# Patient Record
Sex: Female | Born: 1965 | Hispanic: No | Marital: Married | Smoking: Never smoker
Health system: Southern US, Community
[De-identification: ages and names within clinical notes are randomized; demographics above are authoritative.]

## PROBLEM LIST (undated history)

## (undated) DIAGNOSIS — E785 Hyperlipidemia, unspecified: Secondary | ICD-10-CM

## (undated) HISTORY — DX: Hyperlipidemia, unspecified: E78.5

---

## 1998-06-23 ENCOUNTER — Other Ambulatory Visit: Admission: RE | Admit: 1998-06-23 | Discharge: 1998-06-23 | Payer: Self-pay | Admitting: Obstetrics and Gynecology

## 1999-07-26 ENCOUNTER — Other Ambulatory Visit: Admission: RE | Admit: 1999-07-26 | Discharge: 1999-07-26 | Payer: Self-pay | Admitting: Obstetrics and Gynecology

## 2000-09-11 ENCOUNTER — Other Ambulatory Visit: Admission: RE | Admit: 2000-09-11 | Discharge: 2000-09-11 | Payer: Self-pay | Admitting: Obstetrics and Gynecology

## 2001-09-25 ENCOUNTER — Other Ambulatory Visit: Admission: RE | Admit: 2001-09-25 | Discharge: 2001-09-25 | Payer: Self-pay | Admitting: Obstetrics and Gynecology

## 2002-05-21 ENCOUNTER — Encounter: Admission: RE | Admit: 2002-05-21 | Discharge: 2002-05-21 | Payer: Self-pay | Admitting: Obstetrics and Gynecology

## 2002-05-21 ENCOUNTER — Encounter: Payer: Self-pay | Admitting: Obstetrics and Gynecology

## 2002-10-28 ENCOUNTER — Other Ambulatory Visit: Admission: RE | Admit: 2002-10-28 | Discharge: 2002-10-28 | Payer: Self-pay | Admitting: Obstetrics and Gynecology

## 2002-11-30 ENCOUNTER — Encounter: Admission: RE | Admit: 2002-11-30 | Discharge: 2002-11-30 | Payer: Self-pay | Admitting: Obstetrics and Gynecology

## 2002-11-30 ENCOUNTER — Encounter: Payer: Self-pay | Admitting: Obstetrics and Gynecology

## 2003-11-14 ENCOUNTER — Observation Stay (HOSPITAL_COMMUNITY): Admission: AD | Admit: 2003-11-14 | Discharge: 2003-11-15 | Payer: Self-pay | Admitting: *Deleted

## 2003-12-23 ENCOUNTER — Inpatient Hospital Stay (HOSPITAL_COMMUNITY): Admission: AD | Admit: 2003-12-23 | Discharge: 2003-12-26 | Payer: Self-pay | Admitting: Obstetrics and Gynecology

## 2004-02-03 ENCOUNTER — Other Ambulatory Visit: Admission: RE | Admit: 2004-02-03 | Discharge: 2004-02-03 | Payer: Self-pay | Admitting: Obstetrics and Gynecology

## 2005-03-08 ENCOUNTER — Other Ambulatory Visit: Admission: RE | Admit: 2005-03-08 | Discharge: 2005-03-08 | Payer: Self-pay | Admitting: Obstetrics and Gynecology

## 2010-07-08 ENCOUNTER — Encounter: Payer: Self-pay | Admitting: Obstetrics and Gynecology

## 2012-12-07 ENCOUNTER — Other Ambulatory Visit: Payer: Self-pay | Admitting: Obstetrics and Gynecology

## 2012-12-07 DIAGNOSIS — R928 Other abnormal and inconclusive findings on diagnostic imaging of breast: Secondary | ICD-10-CM

## 2012-12-25 ENCOUNTER — Ambulatory Visit
Admission: RE | Admit: 2012-12-25 | Discharge: 2012-12-25 | Disposition: A | Discharge status: Home / Self Care | Payer: BC Managed Care – PPO | Source: Ambulatory Visit | Attending: Obstetrics and Gynecology | Admitting: Obstetrics and Gynecology

## 2012-12-25 DIAGNOSIS — R928 Other abnormal and inconclusive findings on diagnostic imaging of breast: Secondary | ICD-10-CM

## 2013-12-30 ENCOUNTER — Other Ambulatory Visit: Payer: Self-pay | Admitting: Obstetrics and Gynecology

## 2013-12-30 DIAGNOSIS — Z803 Family history of malignant neoplasm of breast: Secondary | ICD-10-CM

## 2014-01-07 ENCOUNTER — Ambulatory Visit
Admission: RE | Admit: 2014-01-07 | Discharge: 2014-01-07 | Disposition: A | Discharge status: Home / Self Care | Payer: BC Managed Care – PPO | Source: Ambulatory Visit | Attending: Obstetrics and Gynecology | Admitting: Obstetrics and Gynecology

## 2014-01-07 DIAGNOSIS — Z803 Family history of malignant neoplasm of breast: Secondary | ICD-10-CM

## 2014-01-07 MED ORDER — GADOBENATE DIMEGLUMINE 529 MG/ML IV SOLN
10.0000 mL | Freq: Once | INTRAVENOUS | Status: AC | PRN
  Administered 2014-01-07: 10 mL via INTRAVENOUS

## 2014-03-23 ENCOUNTER — Other Ambulatory Visit: Payer: Self-pay | Admitting: Obstetrics and Gynecology

## 2014-03-24 LAB — CYTOLOGY - PAP

## 2015-12-21 ENCOUNTER — Other Ambulatory Visit: Payer: Self-pay | Admitting: Obstetrics and Gynecology

## 2015-12-21 DIAGNOSIS — R928 Other abnormal and inconclusive findings on diagnostic imaging of breast: Secondary | ICD-10-CM

## 2015-12-29 ENCOUNTER — Other Ambulatory Visit: Payer: Self-pay | Admitting: Obstetrics and Gynecology

## 2015-12-29 ENCOUNTER — Ambulatory Visit
Admission: RE | Admit: 2015-12-29 | Discharge: 2015-12-29 | Disposition: A | Discharge status: Home / Self Care | Payer: 59 | Source: Ambulatory Visit | Attending: Obstetrics and Gynecology | Admitting: Obstetrics and Gynecology

## 2015-12-29 DIAGNOSIS — R928 Other abnormal and inconclusive findings on diagnostic imaging of breast: Secondary | ICD-10-CM

## 2016-01-05 ENCOUNTER — Ambulatory Visit
Admission: RE | Admit: 2016-01-05 | Discharge: 2016-01-05 | Disposition: A | Discharge status: Home / Self Care | Payer: 59 | Source: Ambulatory Visit | Attending: Obstetrics and Gynecology | Admitting: Obstetrics and Gynecology

## 2016-01-05 ENCOUNTER — Other Ambulatory Visit: Payer: Self-pay | Admitting: Obstetrics and Gynecology

## 2016-01-05 DIAGNOSIS — R921 Mammographic calcification found on diagnostic imaging of breast: Secondary | ICD-10-CM

## 2016-01-05 DIAGNOSIS — R928 Other abnormal and inconclusive findings on diagnostic imaging of breast: Secondary | ICD-10-CM

## 2016-01-08 ENCOUNTER — Inpatient Hospital Stay: Admission: RE | Admit: 2016-01-08 | Payer: 59 | Source: Ambulatory Visit

## 2016-08-30 ENCOUNTER — Ambulatory Visit (INDEPENDENT_AMBULATORY_CARE_PROVIDER_SITE_OTHER): Payer: 59 | Admitting: Family Medicine

## 2016-08-30 VITALS — BP 110/68 | HR 72 | Temp 98.8°F | Resp 18 | Ht 62.6 in | Wt 111.6 lb

## 2016-08-30 DIAGNOSIS — K644 Residual hemorrhoidal skin tags: Secondary | ICD-10-CM | POA: Diagnosis not present

## 2016-08-30 MED ORDER — HYDROCORTISONE 2.5 % RE CREA: 1.0000 "application " | TOPICAL_CREAM | Freq: Two times a day (BID) | RECTAL | 0 refills | Status: DC

## 2016-08-30 NOTE — Progress Notes (Signed)
  Chief Complaint  Patient presents with  . Hemorrhoids    HPI Pt reports that she had a history of hemorrhoids She reports that she has some rectal pain, no itching No bleeding  She reports that she can feel "a little lump going in and out" She reports that she has not been constipation She has a colonoscopy planned for this year.    No past medical history on file.  Current Outpatient Prescriptions  Medication Sig Dispense Refill  . hydrocortisone (ANUSOL-HC) 2.5 % rectal cream Place 1 application rectally 2 (two) times daily. Use for one week to relieve symptoms 30 g 0   No current facility-administered medications for this visit.     Allergies: No Known Allergies  Past Surgical History:  Procedure Laterality Date  . CESAREAN SECTION      Social History   Social History  . Marital status: Married    Spouse name: N/A  . Number of children: N/A  . Years of education: N/A   Social History Main Topics  . Smoking status: Never Smoker  . Smokeless tobacco: Never Used  . Alcohol use No  . Drug use: Unknown  . Sexual activity: Not Asked   Other Topics Concern  . None   Social History Narrative  . None    ROS  Objective: Vitals:   08/30/16 1458  BP: 110/68  Pulse: 72  Resp: 18  Temp: 98.8 F (37.1 C)  TempSrc: Oral  SpO2: 100%  Weight: 111 lb 9.6 oz (50.6 kg)  Height: 5' 2.6" (1.59 m)    Physical Exam  Constitutional: She is oriented to person, place, and time. She appears well-developed and well-nourished.  HENT:  Head: Normocephalic and atraumatic.  Eyes: Conjunctivae and EOM are normal.  Cardiovascular: Normal rate, regular rhythm and normal heart sounds.   Pulmonary/Chest: Effort normal and breath sounds normal. No respiratory distress. She has no wheezes.  Neurological: She is alert and oriented to person, place, and time.  Skin: Skin is warm. No rash noted. No erythema.  Psychiatric: She has a normal mood and affect. Her behavior is  normal. Judgment and thought content normal.    Visible external hemorrhoid with small thrombosis nontender to palpation Rectal exam shows normal tone No fissures  Assessment and Plan Elmarie was seen today for hemorrhoids.  Diagnoses and all orders for this visit:  External hemorrhoid Gave handout for sitz baths as needed Advised pt to that she should use anusol She should increase fiber Since pt already has plan for colonoscopy will not do a second referral as a routine colonoscopy will suffice -     hydrocortisone (ANUSOL-HC) 2.5 % rectal cream; Place 1 application rectally 2 (two) times daily. Use for one week to relieve symptoms     Waldo Damian A Creta LevinStallings

## 2016-08-30 NOTE — Patient Instructions (Addendum)
   IF you received an x-ray today, you will receive an invoice from [facility]. Please contact [facility] at [phone] with questions or concerns regarding your invoice.   IF you received labwork today, you will receive an invoice from [facility]. Please contact [facility] at [phone] with questions or concerns regarding your invoice.   Our billing staff will not be able to assist you with questions regarding bills from these companies.  You will be contacted with the lab results as soon as they are available. The fastest way to get your results is to activate your My Chart account. Instructions are located on the last page of this paperwork. If you have not heard from us regarding the results in 2 weeks, please contact this office.     Hemorrhoids Hemorrhoids are swollen veins in and around the rectum or anus. There are two types of hemorrhoids:  Internal hemorrhoids. These occur in the veins that are just inside the rectum. They may poke through to the outside and become irritated and painful.  External hemorrhoids. These occur in the veins that are outside of the anus and can be felt as a painful swelling or hard lump near the anus.  Most hemorrhoids do not cause serious problems, and they can be managed with home treatments such as diet and lifestyle changes. If home treatments do not help your symptoms, procedures can be done to shrink or remove the hemorrhoids. What are the causes? This condition is caused by increased pressure in the anal area. This pressure may result from various things, including:  Constipation.  Straining to have a bowel movement.  Diarrhea.  Pregnancy.  Obesity.  Sitting for long periods of time.  Heavy lifting or other activity that causes you to strain.  Anal sex.  What are the signs or symptoms? Symptoms of this condition include:  Pain.  Anal itching or irritation.  Rectal bleeding.  Leakage of stool  (feces).  Anal swelling.  One or more lumps around the anus.  How is this diagnosed? This condition can often be diagnosed through a visual exam. Other exams or tests may also be done, such as:  Examination of the rectal area with a gloved hand (digital rectal exam).  Examination of the anal canal using a small tube (anoscope).  A blood test, if you have lost a significant amount of blood.  A test to look inside the colon (sigmoidoscopy or colonoscopy).  How is this treated? This condition can usually be treated at home. However, various procedures may be done if dietary changes, lifestyle changes, and other home treatments do not help your symptoms. These procedures can help make the hemorrhoids smaller or remove them completely. Some of these procedures involve surgery, and others do not. Common procedures include:  Rubber band ligation. Rubber bands are placed at the base of the hemorrhoids to cut off the blood supply to them.  Sclerotherapy. Medicine is injected into the hemorrhoids to shrink them.  Infrared coagulation. A type of light energy is used to get rid of the hemorrhoids.  Hemorrhoidectomy surgery. The hemorrhoids are surgically removed, and the veins that supply them are tied off.  Stapled hemorrhoidopexy surgery. A circular stapling device is used to remove the hemorrhoids and use staples to cut off the blood supply to them.  Follow these instructions at home: Eating and drinking  Eat foods that have a lot of fiber in them, such as whole grains, beans, nuts, fruits, and vegetables. Ask your health care   provider about taking products that have added fiber (fiber supplements).  Drink enough fluid to keep your urine clear or pale yellow. Managing pain and swelling  Take warm sitz baths for 20 minutes, 3-4 times a day to ease pain and discomfort.  If directed, apply ice to the affected area. Using ice packs between sitz baths may be helpful. ? Put ice in a plastic  bag. ? Place a towel between your skin and the bag. ? Leave the ice on for 20 minutes, 2-3 times a day. General instructions  Take over-the-counter and prescription medicines only as told by your health care provider.  Use medicated creams or suppositories as told.  Exercise regularly.  Go to the bathroom when you have the urge to have a bowel movement. Do not wait.  Avoid straining to have bowel movements.  Keep the anal area dry and clean. Use wet toilet paper or moist towelettes after a bowel movement.  Do not sit on the toilet for long periods of time. This increases blood pooling and pain. Contact a health care provider if:  You have increasing pain and swelling that are not controlled by treatment or medicine.  You have uncontrolled bleeding.  You have difficulty having a bowel movement, or you are unable to have a bowel movement.  You have pain or inflammation outside the area of the hemorrhoids. This information is not intended to replace advice given to you by your health care provider. Make sure you discuss any questions you have with your health care provider. Document Released: 05/31/2000 Document Revised: 11/01/2015 Document Reviewed: 02/15/2015 Elsevier Interactive Patient Education  2017 [employer]  

## 2016-12-16 DIAGNOSIS — Z1231 Encounter for screening mammogram for malignant neoplasm of breast: Secondary | ICD-10-CM | POA: Diagnosis not present

## 2017-01-30 IMAGING — MG DIGITAL DIAGNOSTIC UNILATERAL RIGHT MAMMOGRAM
2 series · 2 of 2 positions shown · non-contrast
Comparison: Previous exam(s).

CLINICAL DATA: Post right breast stereotactic biopsy.

EXAM:
DIAGNOSTIC RIGHT MAMMOGRAM POST STEREOTACTIC BIOPSY

[R CC]
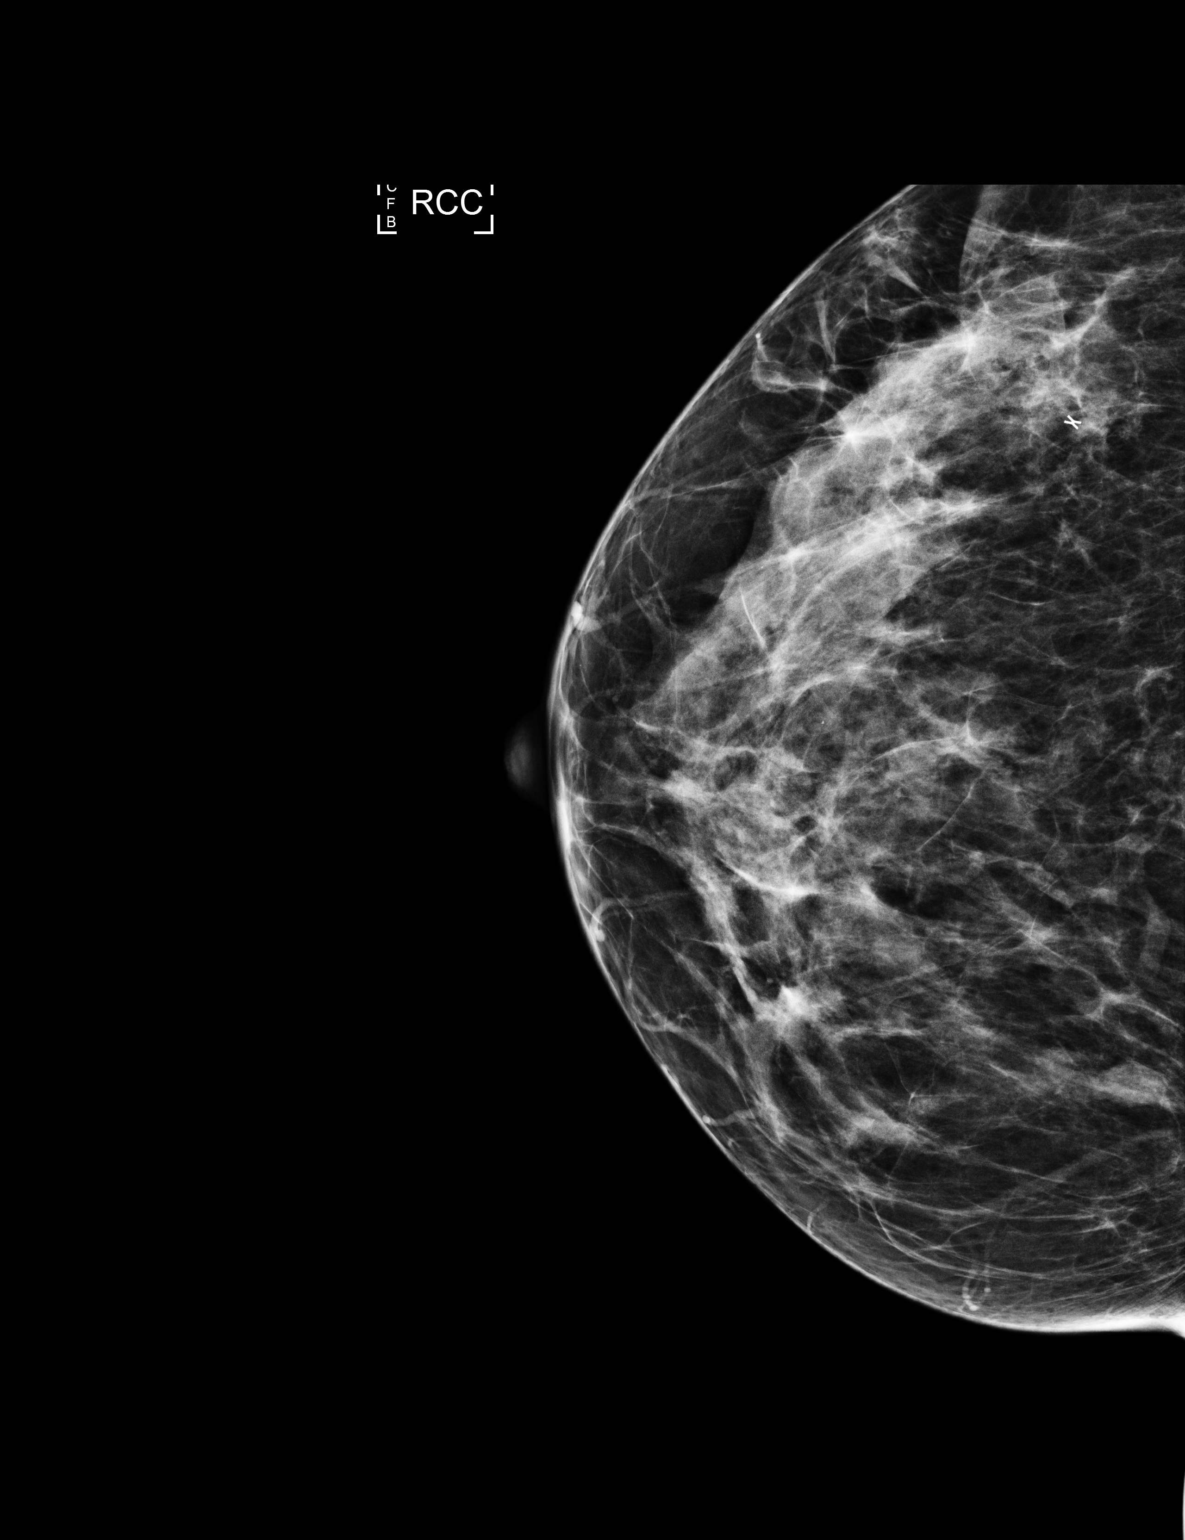

[R ML]
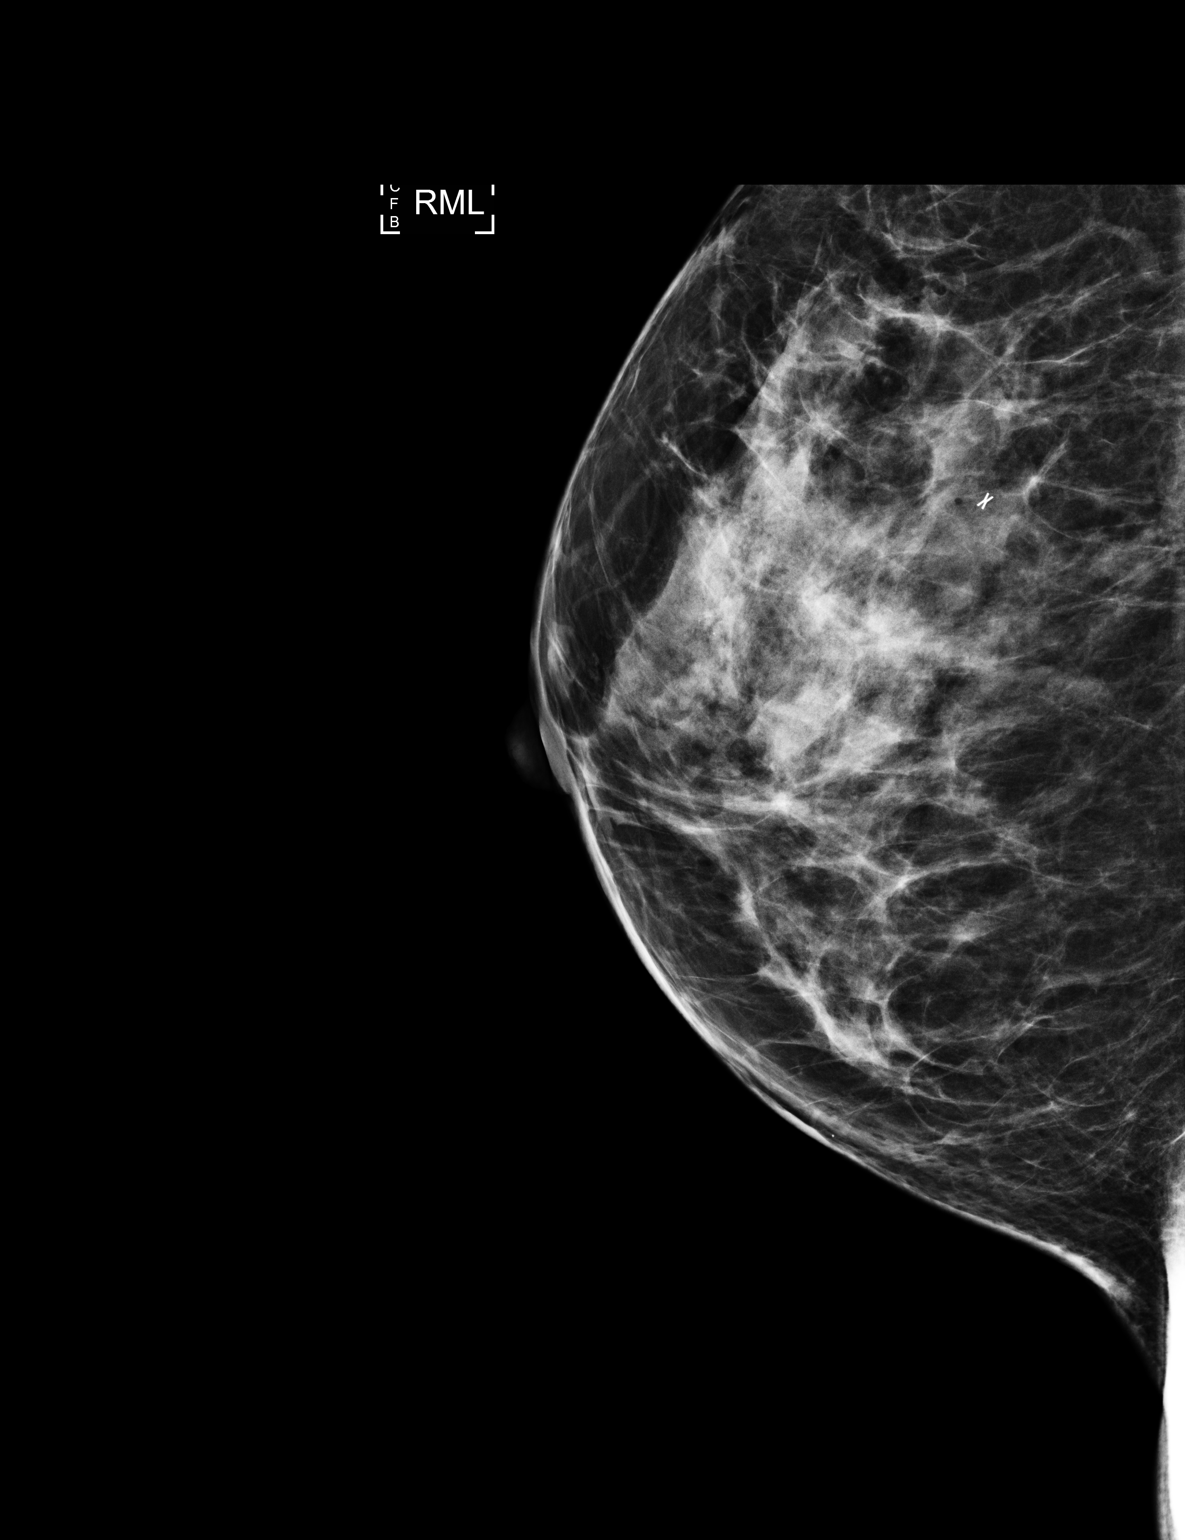

[2 of 2 positions shown; findings below may reference images not displayed]

FINDINGS: Mammographic images were obtained following stereotactic guided
biopsy of the small group of calcifications located within the
upper-outer quadrant of the right breast. The X shaped clip is in
appropriate position.
IMPRESSION: Appropriate position of clip following right breast stereotactic
biopsy.

Final Assessment: Post Procedure Mammograms for Marker Placement

## 2017-04-12 DIAGNOSIS — H40033 Anatomical narrow angle, bilateral: Secondary | ICD-10-CM | POA: Diagnosis not present

## 2017-04-12 DIAGNOSIS — H2513 Age-related nuclear cataract, bilateral: Secondary | ICD-10-CM | POA: Diagnosis not present

## 2017-04-21 DIAGNOSIS — Z01419 Encounter for gynecological examination (general) (routine) without abnormal findings: Secondary | ICD-10-CM | POA: Diagnosis not present

## 2017-04-21 DIAGNOSIS — Z6820 Body mass index (BMI) 20.0-20.9, adult: Secondary | ICD-10-CM | POA: Diagnosis not present

## 2017-05-21 DIAGNOSIS — Z13820 Encounter for screening for osteoporosis: Secondary | ICD-10-CM | POA: Diagnosis not present

## 2017-06-16 DIAGNOSIS — N390 Urinary tract infection, site not specified: Secondary | ICD-10-CM | POA: Diagnosis not present

## 2017-07-09 DIAGNOSIS — N390 Urinary tract infection, site not specified: Secondary | ICD-10-CM | POA: Diagnosis not present

## 2017-07-11 DIAGNOSIS — K640 First degree hemorrhoids: Secondary | ICD-10-CM | POA: Diagnosis not present

## 2017-07-11 DIAGNOSIS — Z1211 Encounter for screening for malignant neoplasm of colon: Secondary | ICD-10-CM | POA: Diagnosis not present

## 2017-12-24 DIAGNOSIS — Z1231 Encounter for screening mammogram for malignant neoplasm of breast: Secondary | ICD-10-CM | POA: Diagnosis not present

## 2018-04-27 DIAGNOSIS — Z01419 Encounter for gynecological examination (general) (routine) without abnormal findings: Secondary | ICD-10-CM | POA: Diagnosis not present

## 2018-04-27 DIAGNOSIS — Z6822 Body mass index (BMI) 22.0-22.9, adult: Secondary | ICD-10-CM | POA: Diagnosis not present

## 2018-06-22 DIAGNOSIS — Z13220 Encounter for screening for lipoid disorders: Secondary | ICD-10-CM | POA: Diagnosis not present

## 2018-06-22 DIAGNOSIS — Z13228 Encounter for screening for other metabolic disorders: Secondary | ICD-10-CM | POA: Diagnosis not present

## 2018-06-22 DIAGNOSIS — Z1329 Encounter for screening for other suspected endocrine disorder: Secondary | ICD-10-CM | POA: Diagnosis not present

## 2019-02-04 ENCOUNTER — Other Ambulatory Visit: Payer: Self-pay | Admitting: Obstetrics and Gynecology

## 2019-02-04 DIAGNOSIS — Z803 Family history of malignant neoplasm of breast: Secondary | ICD-10-CM

## 2021-12-24 ENCOUNTER — Telehealth: Payer: Self-pay

## 2021-12-24 NOTE — Telephone Encounter (Signed)
NOTES SCANNED TO REFERRAL 

## 2021-12-30 NOTE — Progress Notes (Unsigned)
Cardiology Office Note:   Date:  12/31/2021  NAME:  Shelia Mclean    MRN: [id number] DOB:  1965-09-22   PCP:  Richardean Chimera, MD  Cardiologist:  None  Electrophysiologist:  None   Referring MD: Richardean Chimera, MD   Chief Complaint  Patient presents with   Chest Pain    History of Present Illness:   Shelia Mclean is a 56 y.o. female with a hx of HLD who is being seen today for the evaluation of chest pain at the request of McComb, John, MD. she presents with complaints of chest pain and shortness of breath.  Apparently she can get sharp central chest pain that seems to be triggered by stress that occurs every few months.  She reports she had some on July 4.  She thought of her mother who passed away.  Apparently this resulted in chest discomfort that went away.  She is able to exercise on a treadmill several days per week.  She has no chest pain or shortness of breath with this.  She also gets short of breath with laying flat.  Apparently this occurs with laying on her side.  She can lay on her back and do just fine.  She does inform me that she had a episode when she was pregnant 18 years ago.  I did review her CT scans from 2005.  There was evidence of a chest x-ray with pulmonary vascular congestion.  She apparently was treated and had no other issues.  I wonder if she had preeclampsia.  She has had no further episodes.  No congestive heart failure.  She does not smoke.  No alcohol or drug use is reported.  She works as a Neurosurgeon.  She presents with her 87 year old daughter.  She was told she had a heart condition but I suspect this was just possible preeclampsia in the setting of pregnancy with advanced maternal age.  Her blood pressure is 100/60.  She does take Zetia.  She is intolerant of statins.  Cholesterol level seems acceptable.  No strong family history of heart disease.  Again her symptoms can occur with stress regarding her chest pain and positional with her shortness of breath.  She  has no evidence of heart failure on today's exam.  Her EKG is normal.    A1c 5.7 T chol 202, HDL 53, LDL 137, TG 64  Problem List Pre-eclampsia?  Past Medical History: Past Medical History:  Diagnosis Date   Hyperlipidemia     Past Surgical History: Past Surgical History:  Procedure Laterality Date   CESAREAN SECTION      Current Medications: Current Meds  Medication Sig   Calcium Carbonate (CALCIUM 600 PO) Take by mouth.   cycloSPORINE (RESTASIS) 0.05 % ophthalmic emulsion Place 1 drop into both eyes 2 (two) times daily.   ezetimibe (ZETIA) 10 MG tablet Take 1 tablet by mouth daily.   Multiple Vitamin (MULTIVITAMIN ADULT PO) Take by mouth.   VITAMIN D PO Take by mouth.     Allergies:    Turmeric and Rosuvastatin   Social History: Social History   Socioeconomic History   Marital status: Married    Spouse name: Not on file   Number of children: 2   Years of education: Not on file   Highest education level: Not on file  Occupational History   Occupation: Neurosurgeon  Tobacco Use   Smoking status: Never   Smokeless tobacco: Never  Substance and Sexual Activity   Alcohol  use: No   Drug use: Never   Sexual activity: Not on file  Other Topics Concern   Not on file  Social History Narrative   Not on file   Social Determinants of Health   Financial Resource Strain: Not on file  Food Insecurity: Not on file  Transportation Needs: Not on file  Physical Activity: Not on file  Stress: Not on file  Social Connections: Not on file     Family History: The patient's family history is not on file.  ROS:   All other ROS reviewed and negative. Pertinent positives noted in the HPI.     EKGs/Labs/Other Studies Reviewed:   The following studies were personally reviewed by me today:  EKG:  EKG is ordered today.  The ekg ordered today demonstrates sinus bradycardia heart rate 53, no acute ischemic changes, and was personally reviewed by me.   Recent Labs: No  results found for requested labs within last 365 days.   Recent Lipid Panel No results found for: "CHOL", "TRIG", "HDL", "CHOLHDL", "VLDL", "LDLCALC", "LDLDIRECT"  Physical Exam:   VS:  BP 100/60   Pulse (!) 59   Ht 5\' 1"  (1.549 m)   Wt 117 lb (53.1 kg)   SpO2 98%   BMI 22.11 kg/m    Wt Readings from Last 3 Encounters:  12/31/21 117 lb (53.1 kg)  08/30/16 111 lb 9.6 oz (50.6 kg)    General: Well nourished, well developed, in no acute distress Head: Atraumatic, normal size  Eyes: PEERLA, EOMI  Neck: Supple, no JVD Endocrine: No thryomegaly Cardiac: Normal S1, S2; RRR; no murmurs, rubs, or gallops Lungs: Clear to auscultation bilaterally, no wheezing, rhonchi or rales  Abd: Soft, nontender, no hepatomegaly  Ext: No edema, pulses 2+ Musculoskeletal: No deformities, BUE and BLE strength normal and equal Skin: Warm and dry, no rashes   Neuro: Alert and oriented to person, place, time, and situation, CNII-XII grossly intact, no focal deficits  Psych: Normal mood and affect   ASSESSMENT:   Shelia Mclean is a 56 y.o. female who presents for the following: 1. Precordial pain   2. SOB (shortness of breath)   3. Mixed hyperlipidemia     PLAN:   1. Precordial pain -Intermittent sharp chest pain associated with stress and thoughts of family members have passed.  Symptoms occur infrequently every few months.  EKG is normal.  She can exercise on a treadmill for 30 minutes without chest pain symptoms.  I see no need for further cardiac testing.  Her CV exam is normal.  There is no family history of heart disease.  2. SOB (shortness of breath) -Intermittent shortness of breath with laying on her left or right side.  Does not occur when she lays flat on her back.  Unclear etiology.  She did have a chest x-ray in 2005 when she was pregnant with vascular congestion.  I suspect she had preeclampsia in the setting of advanced maternal age.  She has had no further episodes.  No evidence of heart  failure today on exam.  EKG is normal.  No signs of volume overload.  Unclear etiology.  We will check an echo just to make sure her heart structurally normal.  We will also check a TSH and BNP.  She may just need a chest x-ray.  She has no infectious symptoms.  Unclear what could be causing this.  Would recommend further work-up if our work-up is negative.  3. Mixed hyperlipidemia -Okay to continue  Zetia.  Overall low risk for future heart disease development.      Disposition: Return if symptoms worsen or fail to improve.  Medication Adjustments/Labs and Tests Ordered: Current medicines are reviewed at length with the patient today.  Concerns regarding medicines are outlined above.  Orders Placed This Encounter  Procedures   TSH   Brain natriuretic peptide   EKG 12-Lead   ECHOCARDIOGRAM COMPLETE   No orders of the defined types were placed in this encounter.   Patient Instructions  Medication Instructions:  The current medical regimen is effective;  continue present plan and medications.  *If you need a refill on your cardiac medications before your next appointment, please call your pharmacy*   Lab Work: TSH, BNP today   If you have labs (blood work) drawn today and your tests are completely normal, you will receive your results only by: MyChart Message (if you have MyChart) OR A paper copy in the mail If you have any lab test that is abnormal or we need to change your treatment, we will call you to review the results.   Testing/Procedures: Echocardiogram - Your physician has requested that you have an echocardiogram. Echocardiography is a painless test that uses sound waves to create images of your heart. It provides your doctor with information about the size and shape of your heart and how well your heart's chambers and valves are working. This procedure takes approximately one hour. There are no restrictions for this procedure.     Follow-Up: At [facility], you  and your health needs are our priority.  As part of our continuing mission to provide you with exceptional heart care, we have created designated Provider Care Teams.  These Care Teams include your primary Cardiologist (physician) and Advanced Practice Providers (APPs -  Physician Assistants and Nurse Practitioners) who all work together to provide you with the care you need, when you need it.  We recommend signing up for the patient portal called "MyChart".  Sign up information is provided on this After Visit Summary.  MyChart is used to connect with patients for Virtual Visits (Telemedicine).  Patients are able to view lab/test results, encounter notes, upcoming appointments, etc.  Non-urgent messages can be sent to your provider as well.   To learn more about what you can do with MyChart, go to [email or web address].    Your next appointment:   As needed  The format for your next appointment:   In Person  Provider:   Lennie Odor, MD            Signed, Lenna Gilford. Flora Lipps, MD, [facility]  [facility]  [address] [place], [place] [address] [phone]  12/31/2021 10:05 AM

## 2021-12-31 ENCOUNTER — Ambulatory Visit (INDEPENDENT_AMBULATORY_CARE_PROVIDER_SITE_OTHER): Payer: Commercial Managed Care - HMO | Admitting: Cardiovascular Disease

## 2021-12-31 ENCOUNTER — Encounter: Payer: Self-pay | Admitting: Cardiovascular Disease

## 2021-12-31 VITALS — BP 100/60 | HR 59 | Ht 61.0 in | Wt 117.0 lb

## 2021-12-31 DIAGNOSIS — R0602 Shortness of breath: Secondary | ICD-10-CM

## 2021-12-31 DIAGNOSIS — E782 Mixed hyperlipidemia: Secondary | ICD-10-CM | POA: Diagnosis not present

## 2021-12-31 DIAGNOSIS — R072 Precordial pain: Secondary | ICD-10-CM | POA: Diagnosis not present

## 2021-12-31 LAB — BRAIN NATRIURETIC PEPTIDE

## 2021-12-31 LAB — TSH: TSH: 1.1 u[IU]/mL (ref 0.450–4.500)

## 2021-12-31 NOTE — Patient Instructions (Signed)
Medication Instructions:  The current medical regimen is effective;  continue present plan and medications.  *If you need a refill on your cardiac medications before your next appointment, please call your pharmacy*   Lab Work: TSH, BNP today   If you have labs (blood work) drawn today and your tests are completely normal, you will receive your results only by: MyChart Message (if you have MyChart) OR A paper copy in the mail If you have any lab test that is abnormal or we need to change your treatment, we will call you to review the results.   Testing/Procedures: Echocardiogram - Your physician has requested that you have an echocardiogram. Echocardiography is a painless test that uses sound waves to create images of your heart. It provides your doctor with information about the size and shape of your heart and how well your heart's chambers and valves are working. This procedure takes approximately one hour. There are no restrictions for this procedure.     Follow-Up: At [facility], you and your health needs are our priority.  As part of our continuing mission to provide you with exceptional heart care, we have created designated Provider Care Teams.  These Care Teams include your primary Cardiologist (physician) and Advanced Practice Providers (APPs -  Physician Assistants and Nurse Practitioners) who all work together to provide you with the care you need, when you need it.  We recommend signing up for the patient portal called "MyChart".  Sign up information is provided on this After Visit Summary.  MyChart is used to connect with patients for Virtual Visits (Telemedicine).  Patients are able to view lab/test results, encounter notes, upcoming appointments, etc.  Non-urgent messages can be sent to your provider as well.   To learn more about what you can do with MyChart, go to [email or web address].    Your next appointment:   As needed  The format for your next appointment:    In Person  Provider:   Lennie Odor, MD

## 2022-01-07 ENCOUNTER — Telehealth: Payer: Self-pay

## 2022-01-07 NOTE — Telephone Encounter (Signed)
Called patient in regards to results.  Patient did not answer- unable to leave message.  Will mail letter.

## 2022-01-11 ENCOUNTER — Ambulatory Visit (HOSPITAL_COMMUNITY): Payer: Commercial Managed Care - HMO | Attending: Cardiovascular Disease

## 2022-01-11 DIAGNOSIS — I071 Rheumatic tricuspid insufficiency: Secondary | ICD-10-CM | POA: Insufficient documentation

## 2022-01-11 DIAGNOSIS — R0602 Shortness of breath: Secondary | ICD-10-CM | POA: Diagnosis not present

## 2022-01-11 LAB — ECHOCARDIOGRAM COMPLETE
Area-P 1/2: 3.5 cm2
S' Lateral: 2.2 cm

## 2022-08-15 NOTE — Progress Notes (Signed)
Cardiology Office Note:   Date:  08/16/2022  NAME:  Shelia Mclean    MRN: [id number] DOB:  [birth year]   PCP:  Arvella Nigh, MD  Cardiologist:  None  Electrophysiologist:  None   Referring MD: Arvella Nigh, MD   Chief Complaint  Patient presents with   Follow-up        History of Present Illness:   Shelia Mclean is a 57 y.o. female with a hx of HLD who presents for follow-up.  She reports her GYN was to be concerned about her triglycerides.  Value 189.  She is very healthy.  Her 10-year ASCVD risk was 1.2%.  There is really no indication to treat her isolated elevated triglycerides.  At this level they are not bothersome or worrisome.  They are not associated with increased risk given her very normal cardiac history.  An echocardiogram was performed in July that was normal.  Her examination remains normal.  She denies any chest pain or trouble breathing.  She works as a Regulatory affairs officer.  Blood pressure within limits.  All of her other values are also within limits.  T chol 205, HDL 52, LDL 120, TG 189  Past Medical History: Past Medical History:  Diagnosis Date   Hyperlipidemia     Past Surgical History: Past Surgical History:  Procedure Laterality Date   CESAREAN SECTION      Current Medications: Current Meds  Medication Sig   Calcium Carbonate (CALCIUM 600 PO) Take by mouth.   cycloSPORINE (RESTASIS) 0.05 % ophthalmic emulsion Place 1 drop into both eyes 2 (two) times daily.   ezetimibe (ZETIA) 10 MG tablet Take 1 tablet by mouth daily.   Multiple Vitamin (MULTIVITAMIN ADULT PO) Take by mouth.   VITAMIN D PO Take by mouth.     Allergies:    Turmeric and Rosuvastatin   Social History: Social History   Socioeconomic History   Marital status: Married    Spouse name: Not on file   Number of children: 2   Years of education: Not on file   Highest education level: Not on file  Occupational History   Occupation: Regulatory affairs officer  Tobacco Use   Smoking status: Never    Smokeless tobacco: Never  Substance and Sexual Activity   Alcohol use: No   Drug use: Never   Sexual activity: Not on file  Other Topics Concern   Not on file  Social History Narrative   Not on file   Social Determinants of Health   Financial Resource Strain: Not on file  Food Insecurity: Not on file  Transportation Needs: Not on file  Physical Activity: Not on file  Stress: Not on file  Social Connections: Not on file     Family History: The patient's family history is not on file.  ROS:   All other ROS reviewed and negative. Pertinent positives noted in the HPI.     EKGs/Labs/Other Studies Reviewed:   The following studies were personally reviewed by me today:   TTE 01/11/2022  1. Left ventricular ejection fraction, by estimation, is 60 to 65%. Left  ventricular ejection fraction by 3D volume is 59 %. The left ventricle has  normal function. The left ventricle has no regional wall motion  abnormalities. Left ventricular diastolic   parameters were normal. The average left ventricular global longitudinal  strain is -26.9 %. The global longitudinal strain is normal.   2. Right ventricular systolic function is normal. The right ventricular  size is normal. There is  normal pulmonary artery systolic pressure. The  estimated right ventricular systolic pressure is 123XX123 mmHg.   3. The mitral valve is normal in structure. Trivial mitral valve  regurgitation. No evidence of mitral stenosis.   4. The aortic valve is tricuspid. There is mild thickening of the aortic  valve. Aortic valve regurgitation is not visualized. No aortic stenosis is  present.   5. The inferior vena cava is normal in size with <50% respiratory  variability, suggesting right atrial pressure of 8 mmHg.   Recent Labs: 12/31/2021: BNP 22.0; TSH 1.100   Recent Lipid Panel No results found for: "CHOL", "TRIG", "HDL", "CHOLHDL", "VLDL", "LDLCALC", "LDLDIRECT"  Physical Exam:   VS:  BP 96/62   Pulse 66    Ht '5\' 1"'$  (1.549 m)   Wt 115 lb 12.8 oz (52.5 kg)   SpO2 99%   BMI 21.88 kg/m    Wt Readings from Last 3 Encounters:  08/16/22 115 lb 12.8 oz (52.5 kg)  12/31/21 117 lb (53.1 kg)  08/30/16 111 lb 9.6 oz (50.6 kg)    General: Well nourished, well developed, in no acute distress Head: Atraumatic, normal size  Eyes: PEERLA, EOMI  Neck: Supple, no JVD Endocrine: No thryomegaly Cardiac: Normal S1, S2; RRR; no murmurs, rubs, or gallops Lungs: Clear to auscultation bilaterally, no wheezing, rhonchi or rales  Abd: Soft, nontender, no hepatomegaly  Ext: No edema, pulses 2+ Musculoskeletal: No deformities, BUE and BLE strength normal and equal Skin: Warm and dry, no rashes   Neuro: Alert and oriented to person, place, time, and situation, CNII-XII grossly intact, no focal deficits  Psych: Normal mood and affect   ASSESSMENT:   Shelia Mclean is a 57 y.o. female who presents for the following: 1. Mixed hyperlipidemia     PLAN:   1. Mixed hyperlipidemia -Triglycerides are minimally elevated at 189.  10-year ASCVD risk score is 1.2% which is very low.  Echocardiogram in July was normal.  Her blood pressure is normal.  She denies any chest pains or trouble breathing.  There is really no indication to treat her isolated triglycerides with any specific medication.  She had issues with Crestor in the past.  I would just recommend diet and exercise.  She is on Zetia which is controlling her LDL cholesterol.  She overall is very low risk for heart disease.  Would recommend a conservative approach with diet.  We will send this information to her GYN.  She will see [place] back as needed.  Disposition: Return if symptoms worsen or fail to improve.  Medication Adjustments/Labs and Tests Ordered: Current medicines are reviewed at length with the patient today.  Concerns regarding medicines are outlined above.  No orders of the defined types were placed in this encounter.  No orders of the defined types  were placed in this encounter.   Patient Instructions  Medication Instructions:  The current medical regimen is effective;  continue present plan and medications.  *If you need a refill on your cardiac medications before your next appointment, please call your pharmacy*   Follow-Up: At [facility], you and your health needs are our priority.  As part of our continuing mission to provide you with exceptional heart care, we have created designated Provider Care Teams.  These Care Teams include your primary Cardiologist (physician) and Advanced Practice Providers (APPs -  Physician Assistants and Nurse Practitioners) who all work together to provide you with the care you need, when you need it.  We  recommend signing up for the patient portal called "MyChart".  Sign up information is provided on this After Visit Summary.  MyChart is used to connect with patients for Virtual Visits (Telemedicine).  Patients are able to view lab/test results, encounter notes, upcoming appointments, etc.  Non-urgent messages can be sent to your provider as well.   To learn more about what you can do with MyChart, go to [email or web address].    Your next appointment:   As needed  Provider:   Eleonore Chiquito, MD     Time Spent with Patient: I have spent a total of 25 minutes with patient reviewing hospital notes, telemetry, EKGs, labs and examining the patient as well as establishing an assessment and plan that was discussed with the patient.  > 50% of time was spent in direct patient care.  Signed, Addison Naegeli. Audie Box, MD, [place]  [address] [place], [place] [address] [phone]  08/16/2022 8:27 AM

## 2022-08-16 ENCOUNTER — Encounter: Payer: Self-pay | Admitting: Cardiovascular Disease

## 2022-08-16 ENCOUNTER — Ambulatory Visit: Payer: Commercial Managed Care - HMO | Attending: Cardiovascular Disease | Admitting: Cardiovascular Disease

## 2022-08-16 VITALS — BP 96/62 | HR 66 | Ht 61.0 in | Wt 115.8 lb

## 2022-08-16 DIAGNOSIS — E782 Mixed hyperlipidemia: Secondary | ICD-10-CM | POA: Diagnosis not present

## 2022-08-16 NOTE — Patient Instructions (Signed)
Medication Instructions:  The current medical regimen is effective;  continue present plan and medications.  *If you need a refill on your cardiac medications before your next appointment, please call your pharmacy*   Follow-Up: At [facility], you and your health needs are our priority.  As part of our continuing mission to provide you with exceptional heart care, we have created designated Provider Care Teams.  These Care Teams include your primary Cardiologist (physician) and Advanced Practice Providers (APPs -  Physician Assistants and Nurse Practitioners) who all work together to provide you with the care you need, when you need it.  We recommend signing up for the patient portal called "MyChart".  Sign up information is provided on this After Visit Summary.  MyChart is used to connect with patients for Virtual Visits (Telemedicine).  Patients are able to view lab/test results, encounter notes, upcoming appointments, etc.  Non-urgent messages can be sent to your provider as well.   To learn more about what you can do with MyChart, go to [email or web address].    Your next appointment:   As needed  Provider:   Eleonore Chiquito, MD

## 2023-04-30 ENCOUNTER — Other Ambulatory Visit: Payer: Self-pay | Admitting: Obstetrics and Gynecology

## 2023-04-30 DIAGNOSIS — R928 Other abnormal and inconclusive findings on diagnostic imaging of breast: Secondary | ICD-10-CM

## 2023-05-23 ENCOUNTER — Ambulatory Visit
Admission: RE | Admit: 2023-05-23 | Discharge: 2023-05-23 | Disposition: A | Discharge status: Home / Self Care | Payer: Commercial Managed Care - HMO | Source: Ambulatory Visit | Attending: Obstetrics and Gynecology | Admitting: Obstetrics and Gynecology

## 2023-05-23 ENCOUNTER — Ambulatory Visit: Payer: Commercial Managed Care - HMO

## 2023-05-23 DIAGNOSIS — R928 Other abnormal and inconclusive findings on diagnostic imaging of breast: Secondary | ICD-10-CM

## 2023-07-03 DIAGNOSIS — Z13220 Encounter for screening for lipoid disorders: Secondary | ICD-10-CM | POA: Diagnosis not present

## 2023-07-03 DIAGNOSIS — Z1321 Encounter for screening for nutritional disorder: Secondary | ICD-10-CM | POA: Diagnosis not present

## 2023-07-03 DIAGNOSIS — Z13228 Encounter for screening for other metabolic disorders: Secondary | ICD-10-CM | POA: Diagnosis not present

## 2023-07-03 DIAGNOSIS — Z124 Encounter for screening for malignant neoplasm of cervix: Secondary | ICD-10-CM | POA: Diagnosis not present

## 2023-07-03 DIAGNOSIS — Z1151 Encounter for screening for human papillomavirus (HPV): Secondary | ICD-10-CM | POA: Diagnosis not present

## 2023-07-03 DIAGNOSIS — Z01419 Encounter for gynecological examination (general) (routine) without abnormal findings: Secondary | ICD-10-CM | POA: Diagnosis not present

## 2023-07-03 DIAGNOSIS — Z131 Encounter for screening for diabetes mellitus: Secondary | ICD-10-CM | POA: Diagnosis not present

## 2023-07-03 DIAGNOSIS — Z1329 Encounter for screening for other suspected endocrine disorder: Secondary | ICD-10-CM | POA: Diagnosis not present

## 2023-07-03 DIAGNOSIS — Z6823 Body mass index (BMI) 23.0-23.9, adult: Secondary | ICD-10-CM | POA: Diagnosis not present

## 2023-07-22 DIAGNOSIS — N9089 Other specified noninflammatory disorders of vulva and perineum: Secondary | ICD-10-CM | POA: Diagnosis not present

## 2023-12-05 DIAGNOSIS — Z13820 Encounter for screening for osteoporosis: Secondary | ICD-10-CM | POA: Diagnosis not present

## 2023-12-05 DIAGNOSIS — H40013 Open angle with borderline findings, low risk, bilateral: Secondary | ICD-10-CM | POA: Diagnosis not present

## 2024-04-30 DIAGNOSIS — Z1231 Encounter for screening mammogram for malignant neoplasm of breast: Secondary | ICD-10-CM | POA: Diagnosis not present
